# Patient Record
Sex: Male | Born: 1985 | Race: White | Hispanic: No | Marital: Married | Smoking: Never smoker
Health system: Southern US, Community
[De-identification: ages and names within clinical notes are randomized; demographics above are authoritative.]

## PROBLEM LIST (undated history)

## (undated) ENCOUNTER — Emergency Department: Admission: EM | Payer: 59 | Source: Home / Self Care

---

## 2017-09-18 ENCOUNTER — Emergency Department: Payer: 59

## 2017-09-18 ENCOUNTER — Encounter: Payer: Self-pay | Admitting: Emergency Medicine

## 2017-09-18 ENCOUNTER — Emergency Department
Admission: EM | Admit: 2017-09-18 | Discharge: 2017-09-18 | Disposition: A | Discharge status: Home / Self Care | Payer: 59 | Attending: Emergency Medicine | Admitting: Emergency Medicine

## 2017-09-18 ENCOUNTER — Other Ambulatory Visit: Payer: Self-pay

## 2017-09-18 DIAGNOSIS — M5442 Lumbago with sciatica, left side: Secondary | ICD-10-CM | POA: Diagnosis not present

## 2017-09-18 DIAGNOSIS — M545 Low back pain: Secondary | ICD-10-CM | POA: Diagnosis present

## 2017-09-18 MED ORDER — CYCLOBENZAPRINE HCL 10 MG PO TABS
10.0000 mg | ORAL_TABLET | Freq: Once | ORAL | Status: AC
  Administered 2017-09-18: 10 mg via ORAL
  Filled 2017-09-18: qty 1

## 2017-09-18 MED ORDER — KETOROLAC TROMETHAMINE 30 MG/ML IJ SOLN
30.0000 mg | Freq: Once | INTRAMUSCULAR | Status: AC
  Administered 2017-09-18: 30 mg via INTRAMUSCULAR
  Filled 2017-09-18: qty 1

## 2017-09-18 MED ORDER — METHYLPREDNISOLONE 4 MG PO TBPK: ORAL_TABLET | ORAL | 0 refills | Status: DC

## 2017-09-18 MED ORDER — BACLOFEN 10 MG PO TABS: 10.0000 mg | ORAL_TABLET | Freq: Three times a day (TID) | ORAL | 1 refills | Status: DC

## 2017-09-18 NOTE — ED Provider Notes (Signed)
[facility] Emergency Department Provider Note  ____________________________________________   None    (approximate)  I have reviewed the triage vital signs and the nursing notes.   HISTORY  Chief Complaint Back Pain    HPI Kevin Holland is a 32 y.o. male  C/o low back pain for 14 day, known injury as he lifted concrete bags about 2 weeks ago., pain is worse with movement, increased with bending over, he states he is unable to lift the left leg without pain denies numbness, tingling, or changes in bowel/urinary habits,  Using otc meds without relief Remainder ros neg   History reviewed. No pertinent past medical history.  There are no active problems to display for this patient.   History reviewed. No pertinent surgical history.  Prior to Admission medications   Medication Sig Start Date End Date Taking? Authorizing Provider  baclofen (LIORESAL) 10 MG tablet Take 1 tablet (10 mg total) by mouth 3 (three) times daily. 09/18/17 09/18/18  Quintez Maselli, Roselyn Bering, PA-C  methylPREDNISolone (MEDROL DOSEPAK) 4 MG TBPK tablet Take 6 pills on day one then decrease by 1 pill each day 09/18/17   Faythe Ghee, PA-C    Allergies Patient has no known allergies.  No family history on file.  Social History Social History   Tobacco Use  . Smoking status: Never Smoker  . Smokeless tobacco: Never Used  Substance Use Topics  . Alcohol use: Yes  . Drug use: Never    Review of Systems  Constitutional: No fever/chills Eyes: No visual changes. ENT: No sore throat. Respiratory: Denies cough Genitourinary: Negative for dysuria. Musculoskeletal: Positive for back pain. Skin: Negative for rash.    ____________________________________________   PHYSICAL EXAM:  VITAL SIGNS: ED Triage Vitals  Enc Vitals Group     BP 09/18/17 0919 (!) 143/92     Pulse Rate 09/18/17 0919 89     Resp 09/18/17 0919 16     Temp 09/18/17 0919 97.6 F (36.4 C)     Temp Source  09/18/17 0919 Oral     SpO2 09/18/17 0919 98 %     Weight 09/18/17 0923 (!) 305 lb (138.3 kg)     Height 09/18/17 0923 5\' 10"  (1.778 m)     Head Circumference --      Peak Flow --      Pain Score 09/18/17 0922 9     Pain Loc --      Pain Edu? --      Excl. in [place]? --     Constitutional: Alert and oriented. Well appearing and in no acute distress. Eyes: Conjunctivae are normal.  Head: Atraumatic. Nose: No congestion/rhinnorhea. Mouth/Throat: Mucous membranes are moist.   Neck:  supple no lymphadenopathy noted Cardiovascular: Normal rate, regular rhythm.  Respiratory: Normal respiratory effort.  No retractions GU: deferred Musculoskeletal: FROM all extremities, warm and well perfused.  Decreased rom of back due to discomfort, lumbar spine mildly tender around L4-L5, negative Slr, 10/10 strength in great toes b/l, 10/10 strength in lower legs, n/v intact Neurologic:  Normal speech and language.  Skin:  Skin is warm, dry and intact. No rash noted. Psychiatric: Mood and affect are normal. Speech and behavior are normal.  ____________________________________________   LABS (all labs ordered are listed, but only abnormal results are displayed)  Labs Reviewed - No data to display ____________________________________________   ____________________________________________  RADIOLOGY  X-ray of the lumbar spine has no fractures but shows degenerative changes in the L5-S1  ____________________________________________  PROCEDURES  Procedure(s) performed: Toradol 30 mg IM, Flexeril 10 mg p.o.  Procedures    ____________________________________________   INITIAL IMPRESSION / ASSESSMENT AND PLAN / ED COURSE  Pertinent labs & imaging results that were available during my care of the patient were reviewed by me and considered in my medical decision making (see chart for details).   Patient is 32 year old male presents emergency department complaining of low back pain.  States  that pain for 2 weeks.  He states that he lifted 25 pound bags of concrete 2 weeks ago which started the pain.  He denies any numbness or tingling but states the pain does radiate to the left leg.  States pain is increased when he tries to lift the left leg.  He denies any change in bowel or bladder habits.  On physical exam patient appears well.  Pain is reproduced with bending forward and raising the left leg.  He is able to walk on toes and heels without difficulty.  X-ray lumbar spine is negative  Explained the x-ray results to the patient.  He had been given Toradol 30 mg IM and Flexeril 10 mg p.o.  He states that has not really helped take the edge off but he had just received the injection about 10 minutes prior.  Explained back exercises and follow-up care.  He is to take the steroid pack as instructed.  He was given a prescription for baclofen for his muscle relaxer.  He is to do daily stretches and was given back exercise sheets.  He was discharged in stable condition in the care of his wife.     As part of my medical decision making, I reviewed the following data within the electronic MEDICAL RECORD NUMBER Nursing notes reviewed and incorporated, Old chart reviewed, Radiograph reviewed x-ray of the lumbar spine shows degenerative changes at L5-S1, Notes from prior ED visits and [place] Controlled Substance Database  ____________________________________________   FINAL CLINICAL IMPRESSION(S) / ED DIAGNOSES  Final diagnoses:  Acute midline low back pain with left-sided sciatica      NEW MEDICATIONS STARTED DURING THIS VISIT:  New Prescriptions   BACLOFEN (LIORESAL) 10 MG TABLET    Take 1 tablet (10 mg total) by mouth 3 (three) times daily.   METHYLPREDNISOLONE (MEDROL DOSEPAK) 4 MG TBPK TABLET    Take 6 pills on day one then decrease by 1 pill each day     Note:  This document was prepared using Dragon voice recognition software and may include unintentional dictation errors.       Faythe Ghee, PA-C 09/18/17 1121    Dionne Bucy, MD 09/18/17 [phone]

## 2017-09-18 NOTE — ED Notes (Addendum)
Pt verbalizes discharge information and follow up.  Denies any further concerns. NAD. VSS. Pt ambulatory unable to sign e signature due to signature pad malfunction.

## 2017-09-18 NOTE — ED Notes (Signed)
See triage note  States he lifted a concrete bag about 2 weeks ago  States pain has progressed this am  Pain is mainly to lower back and radiates into left leg  Ambulates with sl limp d/t pain

## 2017-09-18 NOTE — Discharge Instructions (Addendum)
Apply ice to lower back.  Take medication as prescribed.  Return emergency department if worsening.  If you are not improving in the next 5 to 7 days please follow-up with orthopedics.  [facility] has a walk-in clinic or you could call for an appointment.  Their phone number has been provided for you under Dr. Samuel Germany name.

## 2017-09-18 NOTE — ED Notes (Signed)
Here today because of worsening pain, denies any difficulty going to [facility].

## 2017-09-18 NOTE — ED Triage Notes (Signed)
Patient complaining of pain left lower back pain, after picking up a bag of concrete 2 weeks ago, worse when lifting left leg.

## 2017-11-06 ENCOUNTER — Ambulatory Visit
Admission: EM | Admit: 2017-11-06 | Discharge: 2017-11-06 | Disposition: A | Discharge status: Home / Self Care | Payer: 59 | Attending: Emergency Medicine | Admitting: Emergency Medicine

## 2017-11-06 DIAGNOSIS — M545 Low back pain, unspecified: Secondary | ICD-10-CM

## 2017-11-06 MED ORDER — IBUPROFEN 600 MG PO TABS: 600.0000 mg | ORAL_TABLET | Freq: Four times a day (QID) | ORAL | 0 refills | Status: DC | PRN

## 2017-11-06 MED ORDER — METAXALONE 800 MG PO TABS: 800.0000 mg | ORAL_TABLET | Freq: Three times a day (TID) | ORAL | 0 refills | Status: AC

## 2017-11-06 MED ORDER — METHOCARBAMOL 750 MG PO TABS: 1500.0000 mg | ORAL_TABLET | Freq: Three times a day (TID) | ORAL | 0 refills | Status: AC

## 2017-11-06 MED ORDER — METHYLPREDNISOLONE 4 MG PO TBPK: ORAL_TABLET | Freq: Every day | ORAL | 0 refills | Status: DC

## 2017-11-06 NOTE — Discharge Instructions (Addendum)
I am giving you prescription's for 2 different muscle relaxants.  First, Skelaxin, the other, Robaxin.  If the Skelaxin is too expensive, you may fill the Robaxin instead.  Do not fill both.  600 mg of ibuprofen take with 1 g of Tylenol together 3 or 4 times a day as needed for pain.  Deep tissue massage can be helpful.

## 2017-11-06 NOTE — ED Triage Notes (Signed)
Pt here for lumbar back pain that was due to an injury. He was lifting a bag of concrete and did not lift properly and strained his back. Was seen in the ER for this and given medication but states it isn't working.

## 2017-11-06 NOTE — ED Provider Notes (Signed)
HPI  SUBJECTIVE:  Kevin Holland is a 32 y.o. male who presents with an acute worsening of left-sided/central low back pain that started last month after lifting heavy bags of concrete.  States that it resolved, but came back 6 days ago.  He reports the pain as constant, dull, pressure-like, tightness.  States that he cannot get comfortable.  He denies reinjury, trauma, heavy lifting.  He states that he played corn hole for 4- 5 hours prior to the symptoms starting.  Did a lot of bending over to pick up back.  No fevers, leg weakness, distal numbness or tingling, saddle anesthesia.  No abdominal pain, urinary or fecal incontinence, urinary retention.  No urinary complaints.  No radicular pain, sciatica.  He states this is identical to the back pain that he had several weeks ago.  He tried [facility] powders this time with temporary improvement in symptoms, but states that is no longer working.  He is not taking the baclofen.  He states that the Medrol Dosepak helped him significantly when he first injured his back.  Symptoms are better with ice, movement, placing a pillow under his legs or between his legs when he sleeps at night.  He states that his symptoms are worse in the morning and with extending his left leg.  No NSAIDs in the past 2 or 3 days.  States that his mattress is about 41 or 32 years old.  No history of nephrolithiasis, diabetes, hypertension, osteoporosis, cancer.  PMD: He is establishing care at [facility] primary care on 11/8.  Patient was seen in the ER September 8 for acute low midline back strain after living heavy concrete bags.  He reported the pain being present for 2 weeks at that time.  X-ray was negative for fractures, but with degenerative changes in L5, S1.  Sent home with baclofen, steroid pack, back exercises/stretches.    History reviewed. No pertinent past medical history.  History reviewed. No pertinent surgical history.  Family History  Problem Relation Age of Onset  . Healthy  Mother   . Healthy Father     Social History   Tobacco Use  . Smoking status: Never Smoker  . Smokeless tobacco: Never Used  Substance Use Topics  . Alcohol use: Yes  . Drug use: Never    No current facility-administered medications for this encounter.   Current Outpatient Medications:  .  ibuprofen (ADVIL,MOTRIN) 600 MG tablet, Take 1 tablet (600 mg total) by mouth every 6 (six) hours as needed., Disp: 30 tablet, Rfl: 0 .  metaxalone (SKELAXIN) 800 MG tablet, Take 1 tablet (800 mg total) by mouth 3 (three) times daily. Take on an empty stomach, Disp: 30 tablet, Rfl: 0 .  methocarbamol (ROBAXIN) 750 MG tablet, Take 2 tablets (1,500 mg total) by mouth 3 (three) times daily for 5 days., Disp: 30 tablet, Rfl: 0 .  methylPREDNISolone (MEDROL DOSEPAK) 4 MG TBPK tablet, Take by mouth daily. Follow package instructions, Disp: 21 tablet, Rfl: 0  No Known Allergies   ROS  As noted in HPI.   Physical Exam  BP (!) 139/96   Pulse 83   Temp 98.2 F (36.8 C) (Oral)   Resp 18   Wt 135.2 kg   SpO2 97%   BMI 42.76 kg/m   Constitutional: Well developed, well nourished, no acute distress Eyes:  EOMI, conjunctiva normal bilaterally HENT: Normocephalic, atraumatic,mucus membranes moist Respiratory: Normal inspiratory effort Cardiovascular: Normal rate GI: nondistended. No suprapubic or flank tenderness skin: No rash, skin  intact Musculoskeletal: no CVAT. - paralumbar tenderness,  -appreciable muscle spasm.  Points to the left quadratus lumborum as location of the pain.  No bony tenderness. Bilateral lower extremities nontender , baseline ROM with intact  PT pulses,No pain with int/ext rotation flex/extension hips bilaterally. SLR neg bilaterally. Sensation baseline light touch bilaterally for Pt, DTR's symmetric and intact bilaterally KJ, Motor symmetric bilateral 5/5 hip flexion, quadriceps, hamstrings, EHL, foot dorsiflexion, foot plantarflexion, gait normal Neurologic: Alert & oriented  x 3, no focal neuro deficits Psychiatric: Speech and behavior appropriate   ED Course   Medications - No data to display  No orders of the defined types were placed in this encounter.   No results found for this or any previous visit (from the past 24 hour(s)). No results found.  ED Clinical Impression  Acute left-sided low back pain without sciatica  ED Assessment/Plan  Previous ER records reviewed.  As noted in HPI. Care everywhere records reviewed.  No outside records available.   he declined a shot of Toradol.  Patient with an acute flare of a previous lumbar strain that I suspect never completely healed.  In the absence of trauma or bony tenderness, do not think that he needs repeat imaging today.  X-ray immediately after the injury shows degenerative changes L5, S1.  Suspect that playing corn hole in his mattress have contributed to this acute flare.  He states that the Medrol Dosepak helped, so we will send him home with another one, also ibuprofen 600 mg take with 1 g of Tylenol 3 or 4 times a day as needed for pain, Skelaxin, advised deep tissue massage.  He has follow-up with a primary care physician on 11/8.  Discussed  MDM, treatment plan, and plan for follow-up with patient. Discussed sn/sx that should prompt return to the ED. patient agrees with plan.   Meds ordered this encounter  Medications  . ibuprofen (ADVIL,MOTRIN) 600 MG tablet    Sig: Take 1 tablet (600 mg total) by mouth every 6 (six) hours as needed.    Dispense:  30 tablet    Refill:  0  . metaxalone (SKELAXIN) 800 MG tablet    Sig: Take 1 tablet (800 mg total) by mouth 3 (three) times daily. Take on an empty stomach    Dispense:  30 tablet    Refill:  0  . methylPREDNISolone (MEDROL DOSEPAK) 4 MG TBPK tablet    Sig: Take by mouth daily. Follow package instructions    Dispense:  21 tablet    Refill:  0  . methocarbamol (ROBAXIN) 750 MG tablet    Sig: Take 2 tablets (1,500 mg total) by mouth 3  (three) times daily for 5 days.    Dispense:  30 tablet    Refill:  0    *This clinic note was created using Scientist, clinical (histocompatibility and immunogenetics). Therefore, there may be occasional mistakes despite careful proofreading.  ?    Domenick Gong, MD 11/06/17 [phone]

## 2020-09-04 ENCOUNTER — Other Ambulatory Visit: Payer: Self-pay

## 2020-09-04 ENCOUNTER — Ambulatory Visit
Admission: EM | Admit: 2020-09-04 | Discharge: 2020-09-04 | Disposition: A | Discharge status: Home / Self Care | Payer: 59 | Attending: Emergency Medicine | Admitting: Emergency Medicine

## 2020-09-04 ENCOUNTER — Ambulatory Visit (INDEPENDENT_AMBULATORY_CARE_PROVIDER_SITE_OTHER): Payer: 59

## 2020-09-04 DIAGNOSIS — S29011A Strain of muscle and tendon of front wall of thorax, initial encounter: Secondary | ICD-10-CM | POA: Diagnosis not present

## 2020-09-04 DIAGNOSIS — S20212A Contusion of left front wall of thorax, initial encounter: Secondary | ICD-10-CM

## 2020-09-04 DIAGNOSIS — R079 Chest pain, unspecified: Secondary | ICD-10-CM

## 2020-09-04 MED ORDER — PREDNISONE 10 MG PO TABS: ORAL_TABLET | ORAL | 0 refills | Status: AC

## 2020-09-04 NOTE — Discharge Instructions (Addendum)
Take prednisone as directed.  Apply ice to the left rib area three times daily for 10-15 minute intervals.  You may take tylenol or ibuprofen as needed for discomfort.  Return with any shortness of breath, worsening pain, swelling to the left rib area, fever, or chills.
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
## 2021-02-08 ENCOUNTER — Other Ambulatory Visit: Payer: Self-pay

## 2021-02-08 ENCOUNTER — Encounter: Payer: Self-pay | Admitting: Emergency Medicine

## 2021-02-08 ENCOUNTER — Ambulatory Visit
Admission: EM | Admit: 2021-02-08 | Discharge: 2021-02-08 | Disposition: A | Discharge status: Home / Self Care | Payer: 59 | Attending: Emergency Medicine | Admitting: Emergency Medicine

## 2021-02-08 DIAGNOSIS — J0100 Acute maxillary sinusitis, unspecified: Secondary | ICD-10-CM | POA: Diagnosis not present

## 2021-02-08 MED ORDER — AMOXICILLIN 875 MG PO TABS: 875.0000 mg | ORAL_TABLET | Freq: Two times a day (BID) | ORAL | 0 refills | Status: AC

## 2021-02-08 NOTE — ED Triage Notes (Signed)
Pt here with left ear pain and sore throat x 2 weeks.

## 2021-02-08 NOTE — ED Provider Notes (Signed)
Kevin Holland    CSN: [id number] Arrival date & time: 02/08/21  0805      History   Chief Complaint Chief Complaint  Patient presents with   Otalgia   Sore Throat    HPI Kevin Holland is a 36 y.o. male.  Patient presents with 2-week history of sinus congestion, sinus congestion, postnasal drip, ear pain, sore throat, cough.  Treatment at home with DayQuil.  He denies fever, chills, rash, shortness of breath, vomiting, diarrhea, or other symptoms.  No pertinent medical history.   The history is provided by the patient.   History reviewed. No pertinent past medical history.  There are no problems to display for this patient.   History reviewed. No pertinent surgical history.     Home Medications    Prior to Admission medications   Medication Sig Start Date End Date Taking? Authorizing Provider  amoxicillin (AMOXIL) 875 MG tablet Take 1 tablet (875 mg total) by mouth 2 (two) times daily for 10 days. 02/08/21 02/18/21 Yes Sharion Balloon, NP  meloxicam (MOBIC) 15 MG tablet Take by mouth. 03/17/20  Yes [provider]  cyclobenzaprine (FLEXERIL) 10 MG tablet Take by mouth. 03/17/20   [provider]  metaxalone (SKELAXIN) 800 MG tablet Take 1 tablet (800 mg total) by mouth 3 (three) times daily. Take on an empty stomach 11/06/17   Melynda Ripple, MD    Family History Family History  Problem Relation Age of Onset   Healthy Mother    Healthy Father     Social History Social History   Tobacco Use   Smoking status: Never   Smokeless tobacco: Never  Vaping Use   Vaping Use: Never used  Substance Use Topics   Alcohol use: Yes   Drug use: Never     Allergies   Patient has no known allergies.   Review of Systems Review of Systems  Constitutional:  Negative for chills and fever.  HENT:  Positive for congestion, ear pain, postnasal drip, sinus pressure and sore throat.   Respiratory:  Positive for cough. Negative for shortness of breath.    Cardiovascular:  Negative for chest pain and palpitations.  Gastrointestinal:  Negative for diarrhea and vomiting.  Musculoskeletal:  Negative for back pain.  Skin:  Negative for color change and rash.  All other systems reviewed and are negative.   Physical Exam Triage Vital Signs ED Triage Vitals  Enc Vitals Group     BP      Pulse      Resp      Temp      Temp src      SpO2      Weight      Height      Head Circumference      Peak Flow      Pain Score      Pain Loc      Pain Edu?      Excl. in [place]?    No data found.  Updated Vital Signs BP (!) 141/91 (BP Location: Left Arm)    Pulse 88    Temp 98.1 F (36.7 C)    Resp 18    SpO2 96%   Visual Acuity Right Eye Distance:   Left Eye Distance:   Bilateral Distance:    Right Eye Near:   Left Eye Near:    Bilateral Near:     Physical Exam Vitals and nursing note reviewed.  Constitutional:      General:  He is not in acute distress.    Appearance: He is well-developed. He is not ill-appearing.  HENT:     Right Ear: Tympanic membrane normal.     Left Ear: Tympanic membrane normal.     Nose: Congestion present.     Mouth/Throat:     Mouth: Mucous membranes are moist.     Pharynx: Oropharynx is clear.  Cardiovascular:     Rate and Rhythm: Normal rate and regular rhythm.     Heart sounds: Normal heart sounds.  Pulmonary:     Effort: Pulmonary effort is normal. No respiratory distress.     Breath sounds: Normal breath sounds.  Musculoskeletal:     Cervical back: Neck supple.  Skin:    General: Skin is warm and dry.  Neurological:     Mental Status: He is alert.  Psychiatric:        Mood and Affect: Mood normal.        Behavior: Behavior normal.     UC Treatments / Results  Labs (all labs ordered are listed, but only abnormal results are displayed) Labs Reviewed - No data to display  EKG   Radiology No results found.  Procedures Procedures (including critical care time)  Medications Ordered in  UC Medications - No data to display  Initial Impression / Assessment and Plan / UC Course  I have reviewed the triage vital signs and the nursing notes.  Pertinent labs & imaging results that were available during my care of the patient were reviewed by me and considered in my medical decision making (see chart for details).    Acute sinusitis.  Patient has been symptomatic for 2 weeks.  Treating today with amoxicillin.  Discussed ibuprofen and plain Mucinex.  Instructed him to follow up with his PCP if his symptoms are not improving.  He agrees to plan of care.   Final Clinical Impressions(s) / UC Diagnoses   Final diagnoses:  Acute non-recurrent maxillary sinusitis     Discharge Instructions      Take amoxicillin as directed.  Take ibuprofen and plain Mucinex as directed.  Follow up with your primary care provider if your symptoms are not improving.        ED Prescriptions     Medication Sig Dispense Auth. Provider   amoxicillin (AMOXIL) 875 MG tablet Take 1 tablet (875 mg total) by mouth 2 (two) times daily for 10 days. 20 tablet Sharion Balloon, NP      PDMP not reviewed this encounter.   Sharion Balloon, NP 02/08/21 [phone]

## 2021-02-08 NOTE — Discharge Instructions (Addendum)
Take amoxicillin as directed.  Take ibuprofen and plain Mucinex as directed.  Follow up with your primary care provider if your symptoms are not improving.

## 2022-01-10 ENCOUNTER — Ambulatory Visit: Admit: 2022-01-10 | Discharge status: Against Medical Advice | Payer: 59

## 2022-01-11 ENCOUNTER — Ambulatory Visit: Payer: Self-pay

## 2023-05-23 IMAGING — DX DG RIBS W/ CHEST 3+V*L*
3 series · 3 of 3 positions shown · non-contrast
Comparison: None.

CLINICAL DATA: Left-sided chest pain, no injury.

EXAM:
LEFT RIBS AND CHEST - 3+ VIEW

[chest pa]
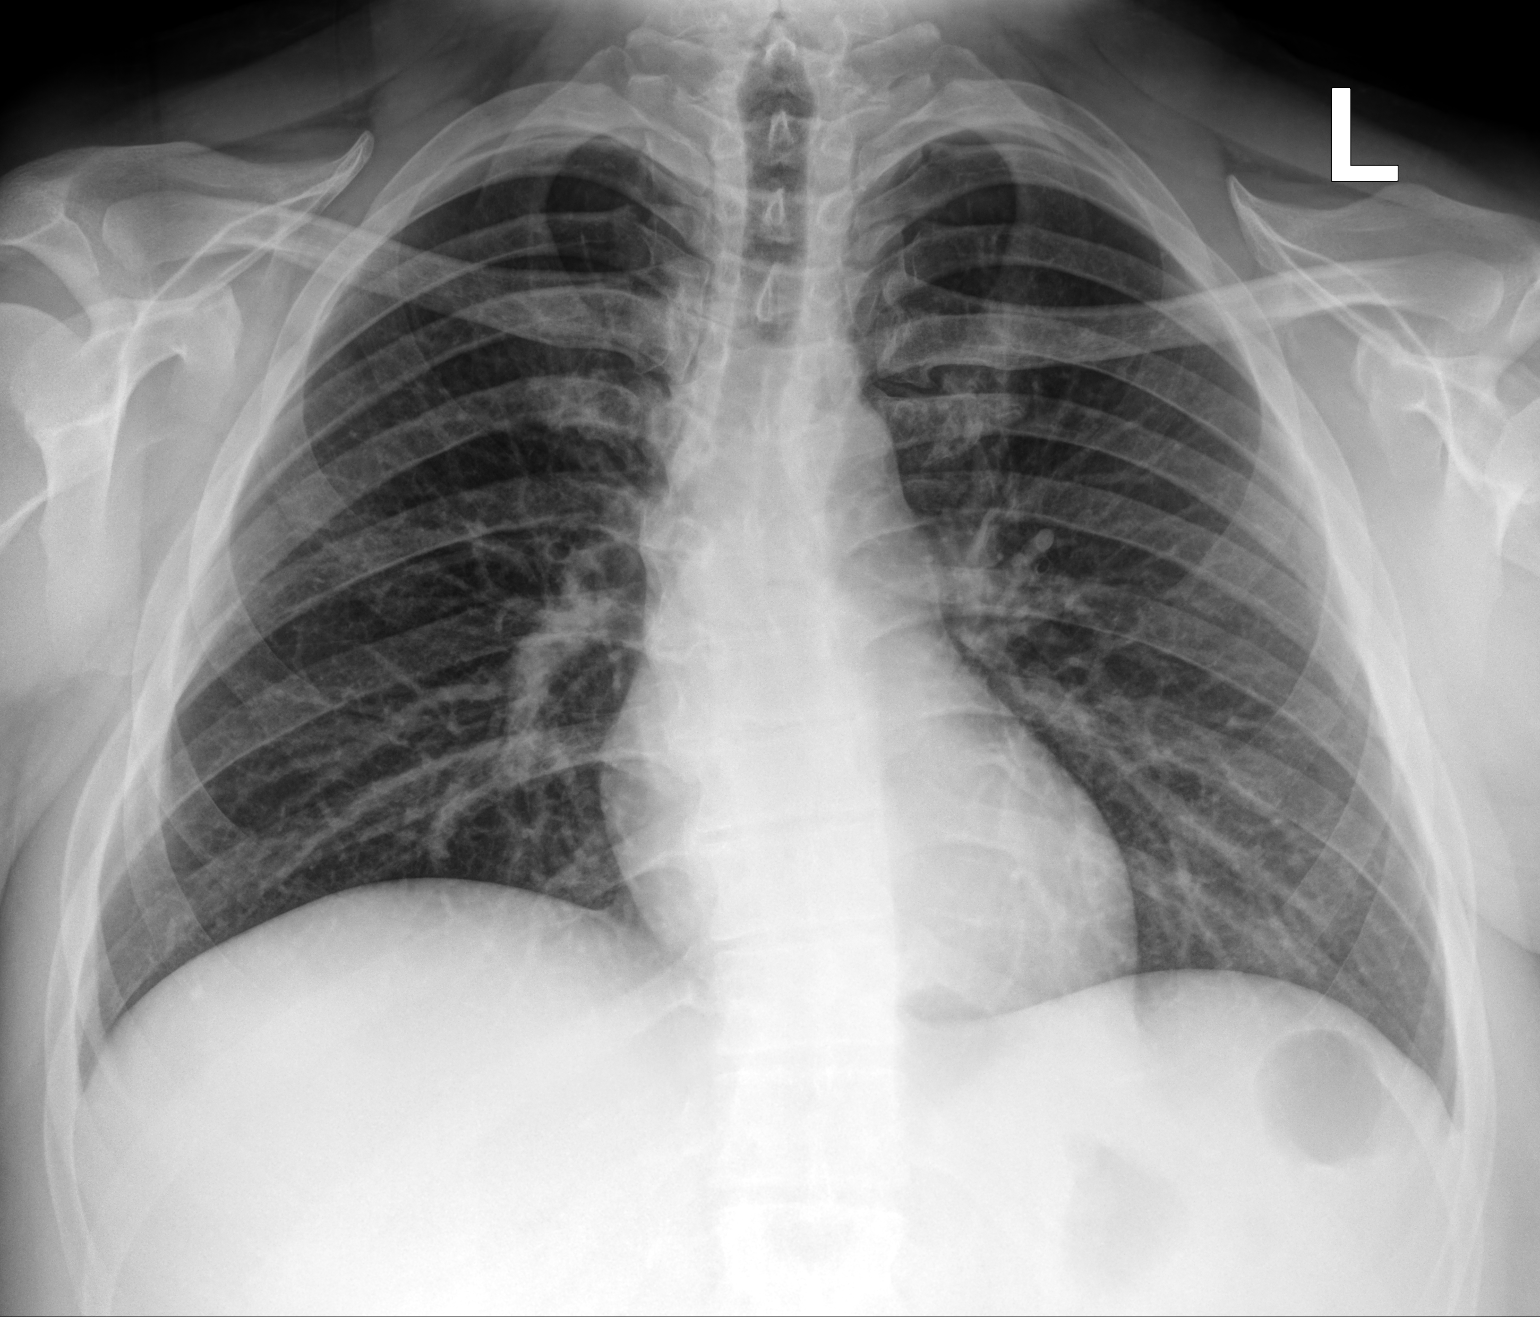

[hemithorax (ribs) pa]
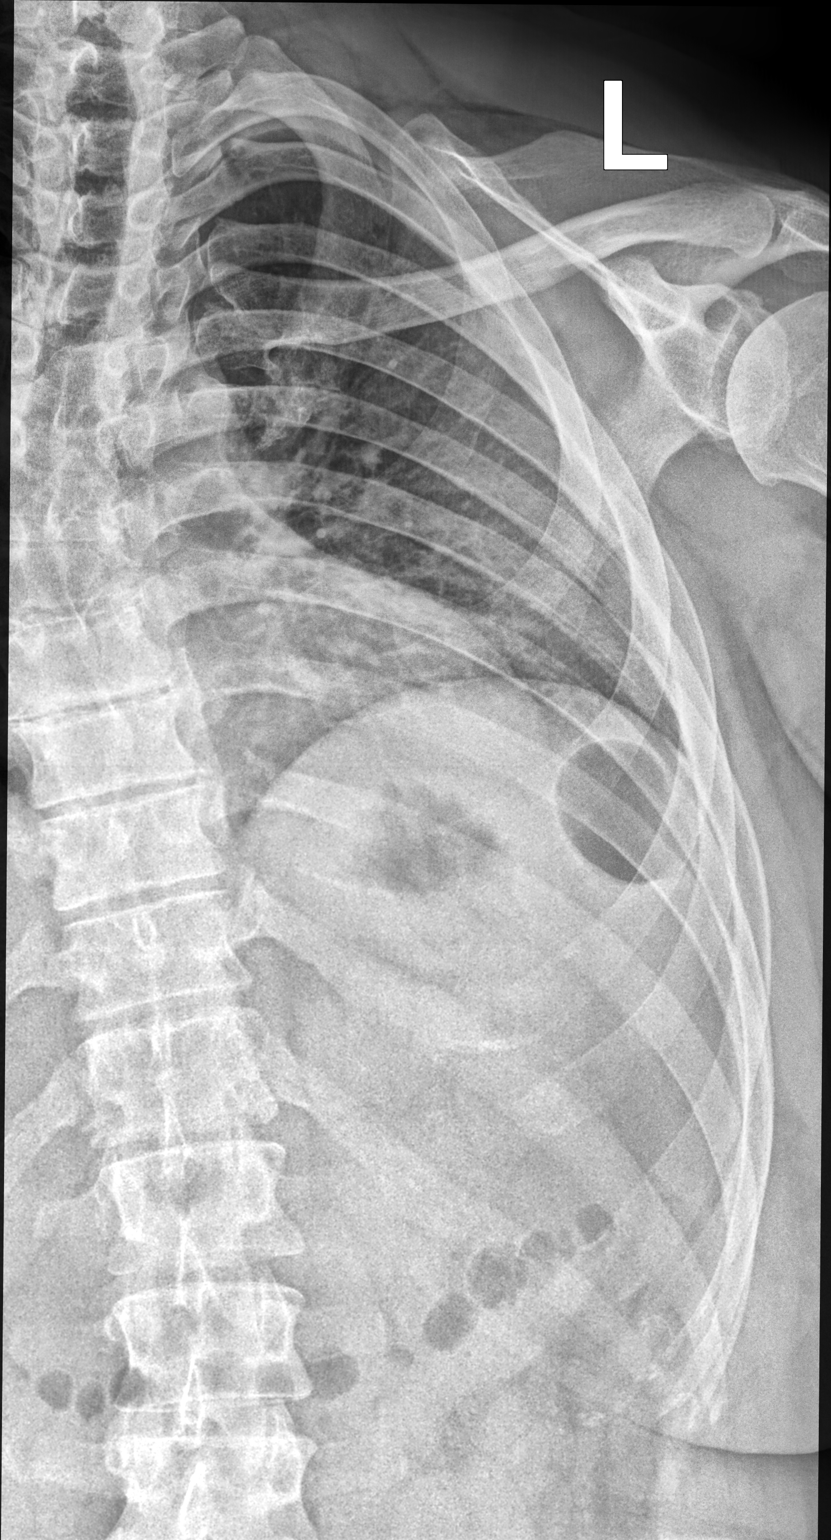

[hemithorax (ribs) oblique]
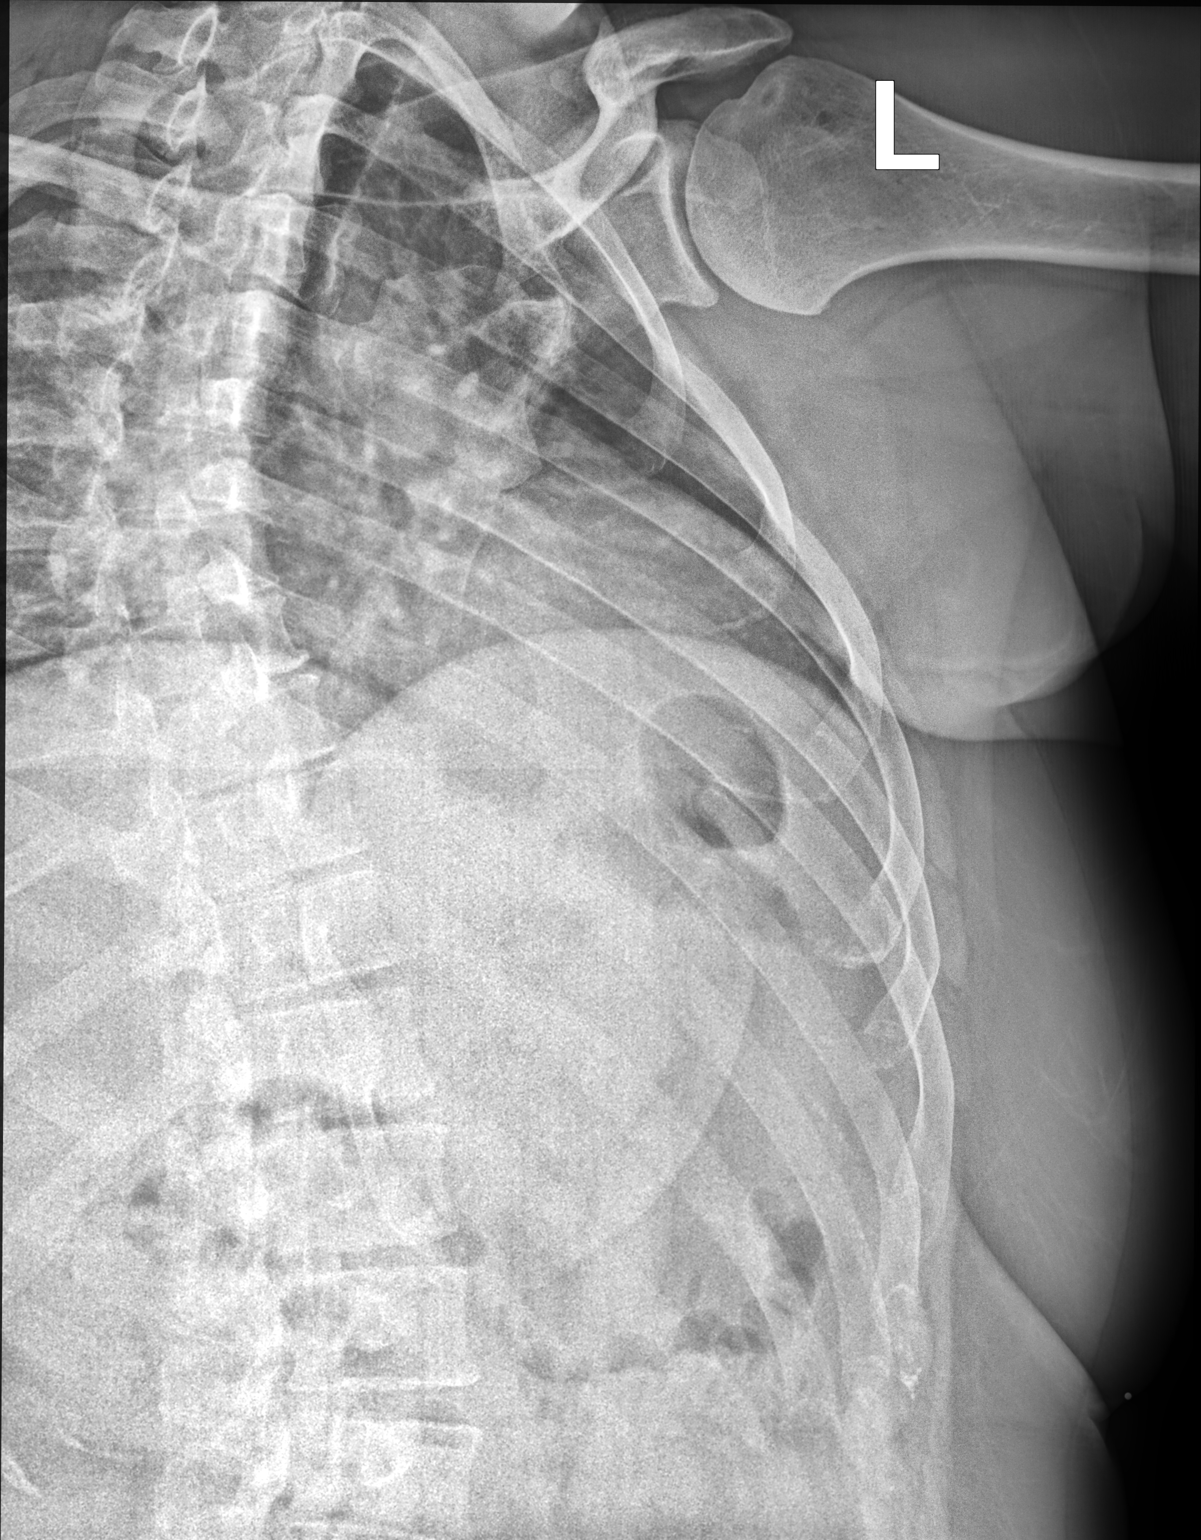

[3 of 3 positions shown; findings below may reference images not displayed]

FINDINGS: Frontal view of the chest shows midline trachea and normal heart
size. Lungs are clear. No pleural fluid. S shaped scoliosis.

Dedicated views of the left ribs show no fracture.
IMPRESSION: No acute findings.
